# Patient Record
Sex: Female | Born: 1960 | Race: White | Hispanic: No | Smoking: Never smoker
Health system: Southern US, Community
[De-identification: ages and names within clinical notes are randomized; demographics above are authoritative.]

## PROBLEM LIST (undated history)

## (undated) DIAGNOSIS — Z87442 Personal history of urinary calculi: Secondary | ICD-10-CM

## (undated) HISTORY — PX: CHOLECYSTECTOMY: SHX55

---

## 2013-02-17 ENCOUNTER — Other Ambulatory Visit: Payer: Self-pay

## 2013-02-17 DIAGNOSIS — Z1231 Encounter for screening mammogram for malignant neoplasm of breast: Secondary | ICD-10-CM

## 2013-03-12 ENCOUNTER — Ambulatory Visit: Payer: Self-pay

## 2013-04-11 ENCOUNTER — Ambulatory Visit
Admission: RE | Admit: 2013-04-11 | Discharge: 2013-04-11 | Disposition: A | Discharge status: Home / Self Care | Payer: PRIVATE HEALTH INSURANCE | Source: Ambulatory Visit

## 2013-04-11 DIAGNOSIS — Z1231 Encounter for screening mammogram for malignant neoplasm of breast: Secondary | ICD-10-CM

## 2013-04-16 ENCOUNTER — Other Ambulatory Visit: Payer: Self-pay | Admitting: Obstetrics and Gynecology

## 2013-04-16 DIAGNOSIS — R928 Other abnormal and inconclusive findings on diagnostic imaging of breast: Secondary | ICD-10-CM

## 2013-05-06 ENCOUNTER — Ambulatory Visit
Admission: RE | Admit: 2013-05-06 | Discharge: 2013-05-06 | Disposition: A | Discharge status: Home / Self Care | Payer: PRIVATE HEALTH INSURANCE | Source: Ambulatory Visit | Attending: Obstetrics and Gynecology | Admitting: Obstetrics and Gynecology

## 2013-05-06 DIAGNOSIS — R928 Other abnormal and inconclusive findings on diagnostic imaging of breast: Secondary | ICD-10-CM

## 2013-10-28 ENCOUNTER — Other Ambulatory Visit: Payer: Self-pay | Admitting: Obstetrics and Gynecology

## 2013-10-28 DIAGNOSIS — IMO0002 Reserved for concepts with insufficient information to code with codable children: Secondary | ICD-10-CM

## 2013-10-28 DIAGNOSIS — R229 Localized swelling, mass and lump, unspecified: Principal | ICD-10-CM

## 2013-11-05 ENCOUNTER — Ambulatory Visit
Admission: RE | Admit: 2013-11-05 | Discharge: 2013-11-05 | Disposition: A | Discharge status: Home / Self Care | Payer: PRIVATE HEALTH INSURANCE | Source: Ambulatory Visit | Attending: Obstetrics and Gynecology | Admitting: Obstetrics and Gynecology

## 2013-11-05 ENCOUNTER — Encounter (INDEPENDENT_AMBULATORY_CARE_PROVIDER_SITE_OTHER): Payer: Self-pay

## 2013-11-05 DIAGNOSIS — R229 Localized swelling, mass and lump, unspecified: Principal | ICD-10-CM

## 2013-11-05 DIAGNOSIS — IMO0002 Reserved for concepts with insufficient information to code with codable children: Secondary | ICD-10-CM

## 2014-04-27 ENCOUNTER — Other Ambulatory Visit: Payer: Self-pay | Admitting: Obstetrics and Gynecology

## 2014-04-27 DIAGNOSIS — N63 Unspecified lump in unspecified breast: Secondary | ICD-10-CM

## 2014-05-13 ENCOUNTER — Ambulatory Visit
Admission: RE | Admit: 2014-05-13 | Discharge: 2014-05-13 | Disposition: A | Discharge status: Home / Self Care | Payer: PRIVATE HEALTH INSURANCE | Source: Ambulatory Visit | Attending: Obstetrics and Gynecology | Admitting: Obstetrics and Gynecology

## 2014-05-13 DIAGNOSIS — N63 Unspecified lump in unspecified breast: Secondary | ICD-10-CM

## 2015-03-23 ENCOUNTER — Encounter (INDEPENDENT_AMBULATORY_CARE_PROVIDER_SITE_OTHER): Payer: Self-pay | Admitting: *Deleted

## 2015-04-20 ENCOUNTER — Other Ambulatory Visit: Payer: Self-pay

## 2015-04-20 DIAGNOSIS — Z1231 Encounter for screening mammogram for malignant neoplasm of breast: Secondary | ICD-10-CM

## 2015-05-03 ENCOUNTER — Ambulatory Visit (INDEPENDENT_AMBULATORY_CARE_PROVIDER_SITE_OTHER): Payer: PRIVATE HEALTH INSURANCE | Admitting: Internal Medicine

## 2015-05-20 ENCOUNTER — Ambulatory Visit
Admission: RE | Admit: 2015-05-20 | Discharge: 2015-05-20 | Disposition: A | Discharge status: Home / Self Care | Payer: PRIVATE HEALTH INSURANCE | Source: Ambulatory Visit

## 2015-05-20 DIAGNOSIS — Z1231 Encounter for screening mammogram for malignant neoplasm of breast: Secondary | ICD-10-CM

## 2015-08-24 ENCOUNTER — Encounter (INDEPENDENT_AMBULATORY_CARE_PROVIDER_SITE_OTHER): Payer: Self-pay | Admitting: *Deleted

## 2015-09-23 ENCOUNTER — Ambulatory Visit (INDEPENDENT_AMBULATORY_CARE_PROVIDER_SITE_OTHER): Payer: PRIVATE HEALTH INSURANCE | Admitting: Internal Medicine

## 2015-10-13 ENCOUNTER — Ambulatory Visit (INDEPENDENT_AMBULATORY_CARE_PROVIDER_SITE_OTHER): Payer: PRIVATE HEALTH INSURANCE | Admitting: Internal Medicine

## 2016-04-18 ENCOUNTER — Other Ambulatory Visit: Payer: Self-pay | Admitting: Obstetrics and Gynecology

## 2016-04-18 DIAGNOSIS — Z1231 Encounter for screening mammogram for malignant neoplasm of breast: Secondary | ICD-10-CM

## 2016-05-22 ENCOUNTER — Ambulatory Visit: Payer: PRIVATE HEALTH INSURANCE

## 2016-06-06 ENCOUNTER — Ambulatory Visit: Payer: PRIVATE HEALTH INSURANCE

## 2016-06-20 ENCOUNTER — Ambulatory Visit
Admission: RE | Admit: 2016-06-20 | Discharge: 2016-06-20 | Disposition: A | Discharge status: Home / Self Care | Payer: PRIVATE HEALTH INSURANCE | Source: Ambulatory Visit | Attending: Obstetrics and Gynecology | Admitting: Obstetrics and Gynecology

## 2016-06-20 DIAGNOSIS — Z1231 Encounter for screening mammogram for malignant neoplasm of breast: Secondary | ICD-10-CM

## 2017-04-20 ENCOUNTER — Other Ambulatory Visit: Payer: Self-pay | Admitting: Obstetrics and Gynecology

## 2017-04-20 DIAGNOSIS — Z1231 Encounter for screening mammogram for malignant neoplasm of breast: Secondary | ICD-10-CM

## 2017-06-22 ENCOUNTER — Ambulatory Visit
Admission: RE | Admit: 2017-06-22 | Discharge: 2017-06-22 | Disposition: A | Discharge status: Home / Self Care | Payer: PRIVATE HEALTH INSURANCE | Source: Ambulatory Visit | Attending: Obstetrics and Gynecology | Admitting: Obstetrics and Gynecology

## 2017-06-22 DIAGNOSIS — Z1231 Encounter for screening mammogram for malignant neoplasm of breast: Secondary | ICD-10-CM

## 2018-05-20 ENCOUNTER — Other Ambulatory Visit: Payer: Self-pay | Admitting: Obstetrics and Gynecology

## 2018-05-20 DIAGNOSIS — Z1231 Encounter for screening mammogram for malignant neoplasm of breast: Secondary | ICD-10-CM

## 2018-07-02 ENCOUNTER — Ambulatory Visit: Payer: PRIVATE HEALTH INSURANCE

## 2021-01-19 ENCOUNTER — Other Ambulatory Visit: Payer: Self-pay | Admitting: Obstetrics and Gynecology

## 2021-01-19 DIAGNOSIS — R928 Other abnormal and inconclusive findings on diagnostic imaging of breast: Secondary | ICD-10-CM

## 2021-02-11 ENCOUNTER — Ambulatory Visit
Admission: RE | Admit: 2021-02-11 | Discharge: 2021-02-11 | Disposition: A | Discharge status: Home / Self Care | Payer: No Typology Code available for payment source | Source: Ambulatory Visit | Attending: Obstetrics and Gynecology | Admitting: Obstetrics and Gynecology

## 2021-02-11 ENCOUNTER — Other Ambulatory Visit: Payer: Self-pay

## 2021-02-11 DIAGNOSIS — R928 Other abnormal and inconclusive findings on diagnostic imaging of breast: Secondary | ICD-10-CM

## 2021-02-11 HISTORY — PX: BREAST BIOPSY: SHX20

## 2021-09-07 ENCOUNTER — Other Ambulatory Visit: Payer: Self-pay | Admitting: Obstetrics and Gynecology

## 2021-09-07 DIAGNOSIS — Z1231 Encounter for screening mammogram for malignant neoplasm of breast: Secondary | ICD-10-CM

## 2022-01-06 ENCOUNTER — Ambulatory Visit
Admission: RE | Admit: 2022-01-06 | Discharge: 2022-01-06 | Disposition: A | Discharge status: Home / Self Care | Payer: No Typology Code available for payment source | Source: Ambulatory Visit | Attending: Obstetrics and Gynecology | Admitting: Obstetrics and Gynecology

## 2022-01-06 DIAGNOSIS — Z1231 Encounter for screening mammogram for malignant neoplasm of breast: Secondary | ICD-10-CM

## 2022-09-20 ENCOUNTER — Other Ambulatory Visit: Payer: Self-pay | Admitting: Family Medicine

## 2022-09-20 DIAGNOSIS — Z1231 Encounter for screening mammogram for malignant neoplasm of breast: Secondary | ICD-10-CM

## 2022-12-14 ENCOUNTER — Encounter (INDEPENDENT_AMBULATORY_CARE_PROVIDER_SITE_OTHER): Payer: Self-pay | Admitting: *Deleted

## 2023-01-03 ENCOUNTER — Telehealth (INDEPENDENT_AMBULATORY_CARE_PROVIDER_SITE_OTHER): Payer: Self-pay | Admitting: Gastroenterology

## 2023-01-03 MED ORDER — PEG 3350-KCL-NA BICARB-NACL 420 G PO SOLR: 4000.0000 mL | Freq: Once | ORAL | 0 refills | Status: AC

## 2023-01-03 NOTE — Addendum Note (Signed)
Addended by: Marlowe Shores on: 01/03/2023 03:52 PM   Modules accepted: Orders

## 2023-01-03 NOTE — Telephone Encounter (Signed)
Pt contacted and colonoscopy scheduled for 01/19/23 at 8:45 am with Dr.Ahmed. prep sent to pharmacy and instructions mailed to patient.

## 2023-01-03 NOTE — Telephone Encounter (Signed)
Who is your primary care physician: [facility]  Reasons for the colonoscopy: Screening  Have you had a colonoscopy before?  Yes oct 2014  Do you have family history of colon cancer? no  Previous colonoscopy with polyps removed? no  Do you have a history colorectal cancer?   no  Are you diabetic? If yes, Type 1 or Type 2?    no  Do you have a prosthetic or mechanical heart valve? no  Do you have a pacemaker/defibrillator?   no  Have you had endocarditis/atrial fibrillation? no  Have you had joint replacement within the last 12 months?  no  Do you tend to be constipated or have to use laxatives? yes  Do you have any history of drugs or alchohol?  no  Do you use supplemental oxygen?  no  Have you had a stroke or heart attack within the last 6 months? no  Do you take weight loss medication?  no  For female patients: have you had a hysterectomy?  no                                     are you post menopausal?       yes                                            do you still have your menstrual cycle? no      Do you take any blood-thinning medications such as: (aspirin, warfarin, Plavix, Aggrenox)  no  If yes we need the name, milligram, dosage and who is prescribing doctor  Current Outpatient Medications on File Prior to Visit  Medication Sig Dispense Refill   omeprazole (PRILOSEC) 20 MG capsule Take 20 mg by mouth 2 (two) times daily.     topiramate (TOPAMAX) 50 MG tablet SMARTSIG:1 Tablet(s) By Mouth Every Evening     No current facility-administered medications on file prior to visit.    No Known Allergies   Pharmacy: [facility]  Primary Insurance Name: [employer] number where you can be reached: [phone]

## 2023-01-08 ENCOUNTER — Encounter (INDEPENDENT_AMBULATORY_CARE_PROVIDER_SITE_OTHER): Payer: Self-pay | Admitting: *Deleted

## 2023-01-08 NOTE — Telephone Encounter (Signed)
Referral completed

## 2023-01-09 ENCOUNTER — Ambulatory Visit
Admission: RE | Admit: 2023-01-09 | Discharge: 2023-01-09 | Disposition: A | Discharge status: Home / Self Care | Payer: 59 | Source: Ambulatory Visit | Attending: Family Medicine | Admitting: Family Medicine

## 2023-01-09 DIAGNOSIS — Z1231 Encounter for screening mammogram for malignant neoplasm of breast: Secondary | ICD-10-CM

## 2023-01-18 ENCOUNTER — Encounter (HOSPITAL_COMMUNITY): Payer: Self-pay | Admitting: Certified Registered Nurse Anesthetist

## 2023-01-19 ENCOUNTER — Ambulatory Visit (HOSPITAL_COMMUNITY): Admission: RE | Admit: 2023-01-19 | Payer: 59 | Source: Home / Self Care

## 2023-01-19 ENCOUNTER — Encounter (HOSPITAL_COMMUNITY): Admission: RE | Payer: Self-pay | Source: Home / Self Care

## 2023-01-19 DIAGNOSIS — Z1211 Encounter for screening for malignant neoplasm of colon: Secondary | ICD-10-CM

## 2023-01-19 SURGERY — COLONOSCOPY WITH PROPOFOL
Anesthesia: Monitor Anesthesia Care

## 2023-01-22 ENCOUNTER — Telehealth: Payer: Self-pay | Admitting: *Deleted

## 2023-01-22 NOTE — Telephone Encounter (Signed)
Called pt to reschedule procedure from 7/19 with Dr. Tasia Catchings. She is going to have to call back once she looks at her schedule and see when she is off to do prep.

## 2023-01-23 NOTE — Telephone Encounter (Signed)
Pt left message returning call Pt would like to hold off until September due to her and her husband being off same days in September. Pt was looking at dates 03/06/23 and 03/20/23. Will call pt once September schedule out.  TCS ASA 1

## 2023-02-14 MED ORDER — PEG 3350-KCL-NA BICARB-NACL 420 G PO SOLR: 4000.0000 mL | Freq: Once | ORAL | 0 refills | Status: AC

## 2023-02-14 NOTE — Addendum Note (Signed)
Addended by: Marlowe Shores on: 02/14/2023 03:12 PM   Modules accepted: Orders

## 2023-02-14 NOTE — Telephone Encounter (Signed)
Pt contacted and rescheduled for 03/12/23 at 11:00am. Prep sent to pharmacy. Instructions mailed to patient.

## 2023-03-08 ENCOUNTER — Telehealth: Payer: Self-pay | Admitting: *Deleted

## 2023-03-08 NOTE — Telephone Encounter (Signed)
Pt called and stated that she has a kidney stone and UTI. She is currently on medication for this. Her procedure is scheduled for 03/12/23. She wants to make sure everything is ok for her to do the procedure. Please advise. Thank you

## 2023-03-08 NOTE — Telephone Encounter (Signed)
I do not see any contraindication to screening colonoscopy as long as she is not having fever , chills or any severe abdominal pain

## 2023-03-08 NOTE — Telephone Encounter (Signed)
Pt informed of providers message. States she is not having any of the symptoms and will see him on Monday.

## 2023-03-12 ENCOUNTER — Encounter (INDEPENDENT_AMBULATORY_CARE_PROVIDER_SITE_OTHER): Payer: Self-pay | Admitting: *Deleted

## 2023-03-12 ENCOUNTER — Ambulatory Visit (HOSPITAL_COMMUNITY): Payer: No Typology Code available for payment source | Admitting: Certified Registered"

## 2023-03-12 ENCOUNTER — Encounter (HOSPITAL_COMMUNITY)
Admission: RE | Disposition: A | Discharge status: Home / Self Care | Payer: Self-pay | Source: Home / Self Care | Attending: Gastroenterology

## 2023-03-12 ENCOUNTER — Encounter (HOSPITAL_COMMUNITY): Payer: Self-pay

## 2023-03-12 ENCOUNTER — Ambulatory Visit (HOSPITAL_COMMUNITY)
Admission: RE | Admit: 2023-03-12 | Discharge: 2023-03-12 | Disposition: A | Discharge status: Home / Self Care | Payer: No Typology Code available for payment source | Attending: Gastroenterology | Admitting: Gastroenterology

## 2023-03-12 ENCOUNTER — Other Ambulatory Visit: Payer: Self-pay

## 2023-03-12 DIAGNOSIS — K648 Other hemorrhoids: Secondary | ICD-10-CM | POA: Diagnosis not present

## 2023-03-12 DIAGNOSIS — Z1211 Encounter for screening for malignant neoplasm of colon: Secondary | ICD-10-CM | POA: Diagnosis present

## 2023-03-12 DIAGNOSIS — D122 Benign neoplasm of ascending colon: Secondary | ICD-10-CM | POA: Diagnosis not present

## 2023-03-12 DIAGNOSIS — D125 Benign neoplasm of sigmoid colon: Secondary | ICD-10-CM

## 2023-03-12 DIAGNOSIS — K644 Residual hemorrhoidal skin tags: Secondary | ICD-10-CM | POA: Diagnosis not present

## 2023-03-12 DIAGNOSIS — D126 Benign neoplasm of colon, unspecified: Secondary | ICD-10-CM

## 2023-03-12 HISTORY — PX: COLONOSCOPY WITH PROPOFOL: SHX5780

## 2023-03-12 HISTORY — DX: Personal history of urinary calculi: Z87.442

## 2023-03-12 HISTORY — PX: POLYPECTOMY: SHX5525

## 2023-03-12 LAB — HM COLONOSCOPY

## 2023-03-12 SURGERY — COLONOSCOPY WITH PROPOFOL
Anesthesia: General

## 2023-03-12 MED ORDER — PROPOFOL 1000 MG/100ML IV EMUL
INTRAVENOUS | Status: AC
  Filled 2023-03-12: qty 100

## 2023-03-12 MED ORDER — LACTATED RINGERS IV SOLN
INTRAVENOUS | Status: DC | PRN
Start: 2023-03-12 — End: 2023-03-12

## 2023-03-12 MED ORDER — PROPOFOL 10 MG/ML IV BOLUS
INTRAVENOUS | Status: DC | PRN
Start: 2023-03-12 — End: 2023-03-12
  Administered 2023-03-12: 100 mg via INTRAVENOUS

## 2023-03-12 MED ORDER — LACTATED RINGERS IV SOLN: INTRAVENOUS | Status: DC

## 2023-03-12 MED ORDER — LIDOCAINE HCL (CARDIAC) PF 100 MG/5ML IV SOSY
PREFILLED_SYRINGE | INTRAVENOUS | Status: DC | PRN
  Administered 2023-03-12: 100 mg via INTRAVENOUS

## 2023-03-12 MED ORDER — PROPOFOL 500 MG/50ML IV EMUL
INTRAVENOUS | Status: DC | PRN
  Administered 2023-03-12: 150 ug/kg/min via INTRAVENOUS

## 2023-03-12 MED ORDER — STERILE WATER FOR IRRIGATION IR SOLN
Status: DC | PRN
  Administered 2023-03-12: 50 mL

## 2023-03-12 NOTE — H&P (Signed)
Primary Care Physician:  Lawerance Sabal, [place] Primary Gastroenterologist:  Dr. Tasia Catchings  Pre-Procedure History & Physical: HPI:  Belinda Cortez is a 62 y.o. female is here for a colonoscopy for colon cancer screening purposes.  Patient denies any family history of colorectal cancer.  No melena or hematochezia.  No abdominal pain or unintentional weight loss.  No change in bowel habits.  Overall feels well from a GI standpoint.  Last colonoscopy 2014   No past medical history on file.  Past Surgical History:  Procedure Laterality Date   BREAST BIOPSY Left 02/11/2021   FLORID USUAL DUCTAL HYPERPLASIA, COLUMNAR CELL    Prior to Admission medications   Medication Sig Start Date End Date Taking? Authorizing Provider  amoxicillin (AMOXIL) 500 MG capsule Take 500 mg by mouth 2 (two) times daily.   Yes [provider]  omeprazole (PRILOSEC) 20 MG capsule Take 20 mg by mouth daily as needed (Heartburn). 09/20/22  Yes [provider]  tamsulosin (FLOMAX) 0.4 MG CAPS capsule Take 0.4 mg by mouth at bedtime.   Yes [provider]  Glycerin-Hypromellose-PEG 400 (DRY EYE RELIEF DROPS OP) Place 1 drop into both eyes daily as needed (dry/ itching eyes).    [provider]  topiramate (TOPAMAX) 50 MG tablet Take 50 mg by mouth at bedtime as needed (Sleep). 12/13/22   [provider]    Allergies as of 02/14/2023   (No Known Allergies)    Family History  Problem Relation Age of Onset   Breast cancer Neg Hx     Social History   Socioeconomic History   Marital status: Unknown    Spouse name: Not on file   Number of children: Not on file   Years of education: Not on file   Highest education level: Not on file  Occupational History   Not on file  Tobacco Use   Smoking status: Not on file   Smokeless tobacco: Not on file  Substance and Sexual Activity   Alcohol use: Not on file   Drug use: Not on file   Sexual activity: Not on file  Other Topics  Concern   Not on file  Social History Narrative   Not on file   Social Determinants of Health   Financial Resource Strain: Not on file  Food Insecurity: No Food Insecurity (08/28/2019)   Received from [facility], [facility]   Hunger Vital Sign    Worried About Running Out of Food in the Last Year: Never true    Ran Out of Food in the Last Year: Never true  Transportation Needs: No Transportation Needs (08/28/2019)   Received from [facility], [facility]   [facility] - Transportation    Lack of Transportation (Medical): No    Lack of Transportation (Non-Medical): No  Physical Activity: Inactive (01/10/2023)   Received from [facility]   Exercise Vital Sign    Days of Exercise per Week: 0 days    Minutes of Exercise per Session: 0 min  Stress: No Stress Concern Present (01/10/2023)   Received from [facility] - Occupational Stress Questionnaire    Feeling of Stress : Only a little  Social Connections: Not on file  Intimate Partner Violence: Not At Risk (01/10/2023)   Received from [facility]   Humiliation, Afraid, Rape, and Kick questionnaire    Fear of Current or Ex-Partner: No    Emotionally Abused: No  Physically Abused: No    Sexually Abused: No    Review of Systems: See HPI, otherwise negative ROS  Physical Exam: Vital signs in last 24 hours:     General:   Alert,  Well-developed, well-nourished, pleasant and cooperative in NAD Head:  Normocephalic and atraumatic. Eyes:  Sclera clear, no icterus.   Conjunctiva pink. Ears:  Normal auditory acuity. Nose:  No deformity, discharge,  or lesions. Msk:  Symmetrical without gross deformities. Normal posture. Extremities:  Without clubbing or edema. Neurologic:  Alert and  oriented x4;  grossly normal neurologically. Skin:  Intact without significant lesions or rashes. Psych:  Alert and cooperative. Normal mood and affect.  Impression/Plan: Belinda Cortez is here for a colonoscopy to be performed for colon cancer screening purposes.  The risks of the procedure including infection, bleed, or perforation as well as benefits, limitations, alternatives and imponderables have been reviewed with the patient. Questions have been answered. All parties agreeable.

## 2023-03-12 NOTE — Anesthesia Postprocedure Evaluation (Signed)
Anesthesia Post Note  Patient: Belinda Cortez  Procedure(s) Performed: COLONOSCOPY WITH PROPOFOL POLYPECTOMY  Patient location during evaluation: Phase II Anesthesia Type: General Level of consciousness: awake Pain management: pain level controlled Vital Signs Assessment: post-procedure vital signs reviewed and stable Respiratory status: spontaneous breathing and respiratory function stable Cardiovascular status: blood pressure returned to baseline and stable Postop Assessment: no headache and no apparent nausea or vomiting Anesthetic complications: no Comments: Late entry   No notable events documented.   Last Vitals:  Vitals:   03/12/23 0929 03/12/23 1113  BP: (!) 144/64 (!) 115/49  Pulse: 65 71  Resp: 18 20  Temp: 36.9 C 36.6 C  SpO2: 97% 97%    Last Pain:  Vitals:   03/12/23 1113  TempSrc: Oral  PainSc: 0-No pain                 Windell Norfolk

## 2023-03-12 NOTE — Op Note (Signed)
[facility] Patient Name: Belinda Cortez Procedure Date: 03/12/2023 10:31 AM MRN: [id number] Date of Birth: [birth year] Attending MD: Sanjuan Dame , MD, [id number] CSN: [id number] Age: 62 Admit Type: Outpatient Procedure:                Colonoscopy Indications:              Screening for colorectal malignant neoplasm Providers:                Sanjuan Dame, MD, Buel Ream. Thomasena Edis RN, RN,                            Lennice Sites Technician, Pensions consultant Referring MD:              Medicines:                Monitored Anesthesia Care Complications:            No immediate complications. Estimated Blood Loss:     Estimated blood loss: none. Procedure:                Pre-Anesthesia Assessment:                           - Prior to the procedure, a History and Physical                            was performed, and patient medications and                            allergies were reviewed. The patient's tolerance of                            previous anesthesia was also reviewed. The risks                            and benefits of the procedure and the sedation                            options and risks were discussed with the patient.                            All questions were answered, and informed consent                            was obtained. Prior Anticoagulants: The patient has                            taken no anticoagulant or antiplatelet agents. ASA                            Grade Assessment: II - A patient with mild systemic                            disease. After reviewing the risks and benefits,  the patient was deemed in satisfactory condition to                            undergo the procedure.                           After obtaining informed consent, the colonoscope                            was passed under direct vision. Throughout the                            procedure, the patient's blood pressure, pulse, and                             oxygen saturations were monitored continuously. The                            [id number]) scope was introduced through                            the anus and advanced to the the terminal ileum.                            The colonoscopy was performed without difficulty.                            The patient tolerated the procedure well. The                            quality of the bowel preparation was evaluated                            using the BBPS [facility] Bowel Preparation Scale)                            with scores of: Right Colon = 2 (minor amount of                            residual staining, small fragments of stool and/or                            opaque liquid, but mucosa seen well), Transverse                            Colon = 2 (minor amount of residual staining, small                            fragments of stool and/or opaque liquid, but mucosa                            seen well) and Left Colon = 2 (minor amount of  residual staining, small fragments of stool and/or                            opaque liquid, but mucosa seen well). The total                            BBPS score equals 6. The terminal ileum, ileocecal                            valve, appendiceal orifice, and rectum were                            photographed. Scope In: 10:47:02 AM Scope Out: 11:09:23 AM Scope Withdrawal Time: 0 hours 18 minutes 38 seconds  Total Procedure Duration: 0 hours 22 minutes 21 seconds  Findings:      The perianal and digital rectal examinations were normal.      A 4 to 8 mm polyp was found in the ascending colon. The polyp was       sessile. The polyp was removed with a cold snare. Resection and       retrieval were complete.      Non-bleeding external and internal hemorrhoids were found during       retroflexion.      The terminal ileum appeared normal. Impression:               - One 4 to 8 mm polyp in the ascending colon,                             removed with a cold snare. Resected and retrieved.                           - Non-bleeding external and internal hemorrhoids.                           - The examined portion of the ileum was normal. Moderate Sedation:      Per Anesthesia Care Recommendation:           - Patient has a contact number available for                            emergencies. The signs and symptoms of potential                            delayed complications were discussed with the                            patient. Return to normal activities tomorrow.                            Written discharge instructions were provided to the                            patient.                           -  Resume previous diet.                           - Continue present medications.                           - Await pathology results.                           - Repeat colonoscopy for surveillance based on                            pathology results.                           - Return to primary care physician as previously                            scheduled. Procedure Code(s):        --- Professional ---                           [phone], Colonoscopy, flexible; with removal of                            tumor(s), polyp(s), or other lesion(s) by snare                            technique Diagnosis Code(s):        --- Professional ---                           Z12.11, Encounter for screening for malignant                            neoplasm of colon                           D12.2, Benign neoplasm of ascending colon                           K64.8, Other hemorrhoids CPT copyright 2022 American Medical Association. All rights reserved. The codes documented in this report are preliminary and upon coder review may  be revised to meet current compliance requirements. Sanjuan Dame, MD Sanjuan Dame, MD 03/12/2023 11:19:07 AM This report has been signed electronically. Number of Addenda: 0

## 2023-03-12 NOTE — Transfer of Care (Addendum)
Immediate Anesthesia Transfer of Care Note  Patient: Belinda Cortez  Procedure(s) Performed: COLONOSCOPY WITH PROPOFOL POLYPECTOMY  Patient Location: Short Stay  Anesthesia Type:General  Level of Consciousness: drowsy and patient cooperative  Airway & Oxygen Therapy: Patient Spontanous Breathing and Patient connected to nasal cannula oxygen  Post-op Assessment: Report given to RN and Post -op Vital signs reviewed and stable  Post vital signs: Reviewed and stable  Last Vitals:  Vitals Value Taken Time  BP 115/49 03/12/23   1113  Temp 36.6 03/12/23   1113  Pulse 71 03/12/23   1113  Resp 20 03/12/23   1113  SpO2 97% 03/12/23   1113    Last Pain:  Vitals:   03/12/23 1043  TempSrc:   PainSc: 0-No pain      Patients Stated Pain Goal: 8 (03/12/23 0929)  Complications: No notable events documented.

## 2023-03-12 NOTE — OR Nursing (Signed)
Belinda Cortez had a procedure at [facility] on 03/12/23 and cannot return to work until noon on 03/13/23.

## 2023-03-12 NOTE — Anesthesia Preprocedure Evaluation (Signed)
Anesthesia Evaluation  Patient identified by MRN, date of birth, ID band Patient awake    Reviewed: Allergy & Precautions, H&P , NPO status , Patient's Chart, lab work & pertinent test results, reviewed documented beta blocker date and time   Airway Mallampati: II  TM Distance: >3 FB Neck ROM: full    Dental no notable dental hx.    Pulmonary neg pulmonary ROS   Pulmonary exam normal breath sounds clear to auscultation       Cardiovascular Exercise Tolerance: Good negative cardio ROS  Rhythm:regular Rate:Normal     Neuro/Psych negative neurological ROS  negative psych ROS   GI/Hepatic negative GI ROS, Neg liver ROS,,,  Endo/Other  negative endocrine ROS    Renal/GU negative Renal ROS  negative genitourinary   Musculoskeletal   Abdominal   Peds  Hematology negative hematology ROS (+)   Anesthesia Other Findings   Reproductive/Obstetrics negative OB ROS                             Anesthesia Physical Anesthesia Plan  ASA: 2  Anesthesia Plan: General   Post-op Pain Management:    Induction:   PONV Risk Score and Plan: Propofol infusion  Airway Management Planned:   Additional Equipment:   Intra-op Plan:   Post-operative Plan:   Informed Consent: I have reviewed the patients History and Physical, chart, labs and discussed the procedure including the risks, benefits and alternatives for the proposed anesthesia with the patient or authorized representative who has indicated his/her understanding and acceptance.     Dental Advisory Given  Plan Discussed with: CRNA  Anesthesia Plan Comments:        Anesthesia Quick Evaluation  

## 2023-03-13 LAB — SURGICAL PATHOLOGY

## 2023-03-14 NOTE — Progress Notes (Signed)
I reviewed the pathology results. Ann, can you send her a letter with the findings as described below please? Repeat colonoscopy in 7 years  Thanks,  Vista Lawman, MD Gastroenterology and Hepatology [facility]  ---------------------------------------------------------------------------------------------  [facility] [address], [place], [place] [address] Phone:  [phone]   03/14/23 [place], [place]   Dear Charlotte Sanes,  I am writing to inform you that the biopsies taken during your recent endoscopic examination showed:  Tubular Adenoma   I am writing to let you know the results of your recent colonoscopy.  You had a total of 1 polyp removed. The pathology came back as "tubular adenoma." These findings are NOT cancer, but had the polyp remained in your colon, they could have turned into cancer.  Given these findings, it is recommended that your next colonoscopy be performed in 7 years.  Please call us at [phone] if you have persistent problems or have questions about your condition that have not been fully answered at this time.  Sincerely,  Vista Lawman, MD Gastroenterology and Hepatology

## 2023-03-15 ENCOUNTER — Encounter (INDEPENDENT_AMBULATORY_CARE_PROVIDER_SITE_OTHER): Payer: Self-pay | Admitting: *Deleted

## 2023-03-20 ENCOUNTER — Encounter (HOSPITAL_COMMUNITY): Payer: Self-pay | Admitting: Gastroenterology

## 2023-08-10 IMAGING — MG MM BREAST LOCALIZATION CLIP
4 series · 4 of 12 positions shown · non-contrast
Comparison: Previous exam(s).

CLINICAL DATA: Evaluate biopsy marker

EXAM:
3D DIAGNOSTIC LEFT MAMMOGRAM POST ULTRASOUND BIOPSY

[L ML synth-2D]
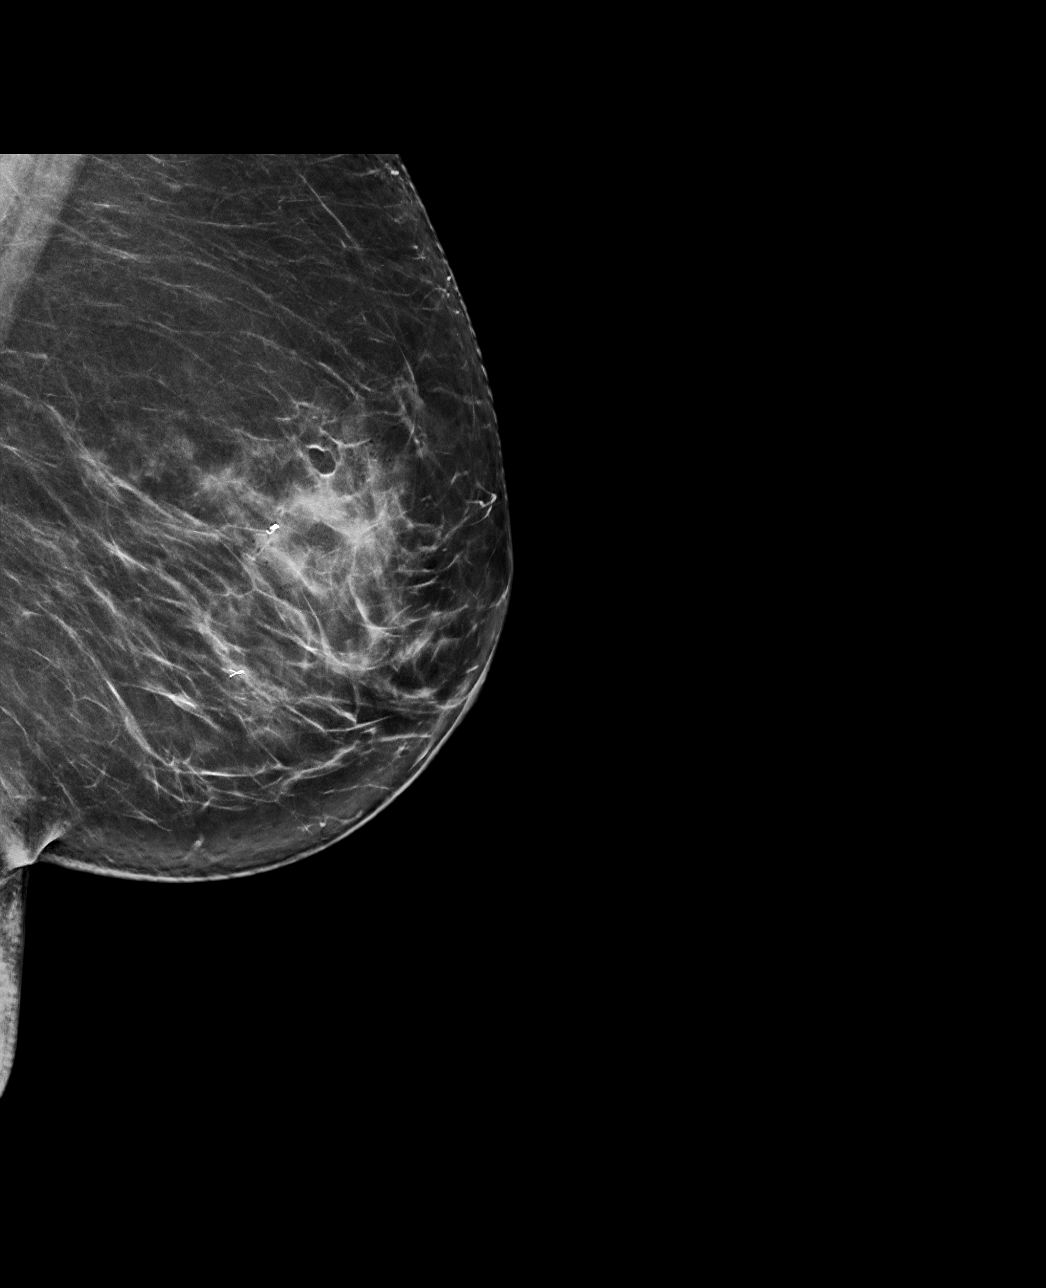

[L CC synth-2D]
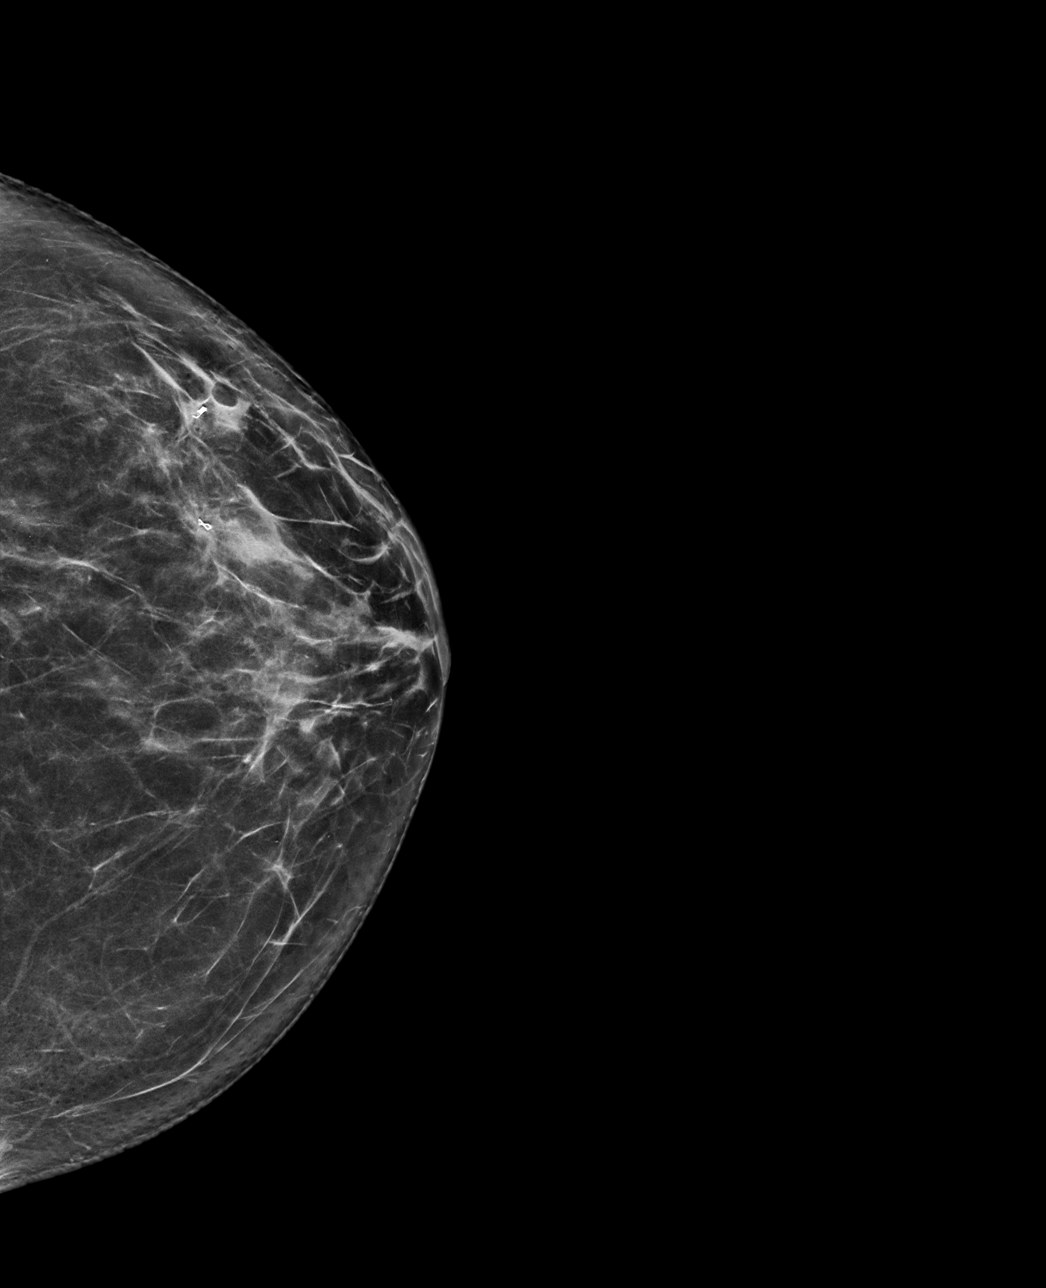

[L CC tomo · tomo slice 37/74.0]
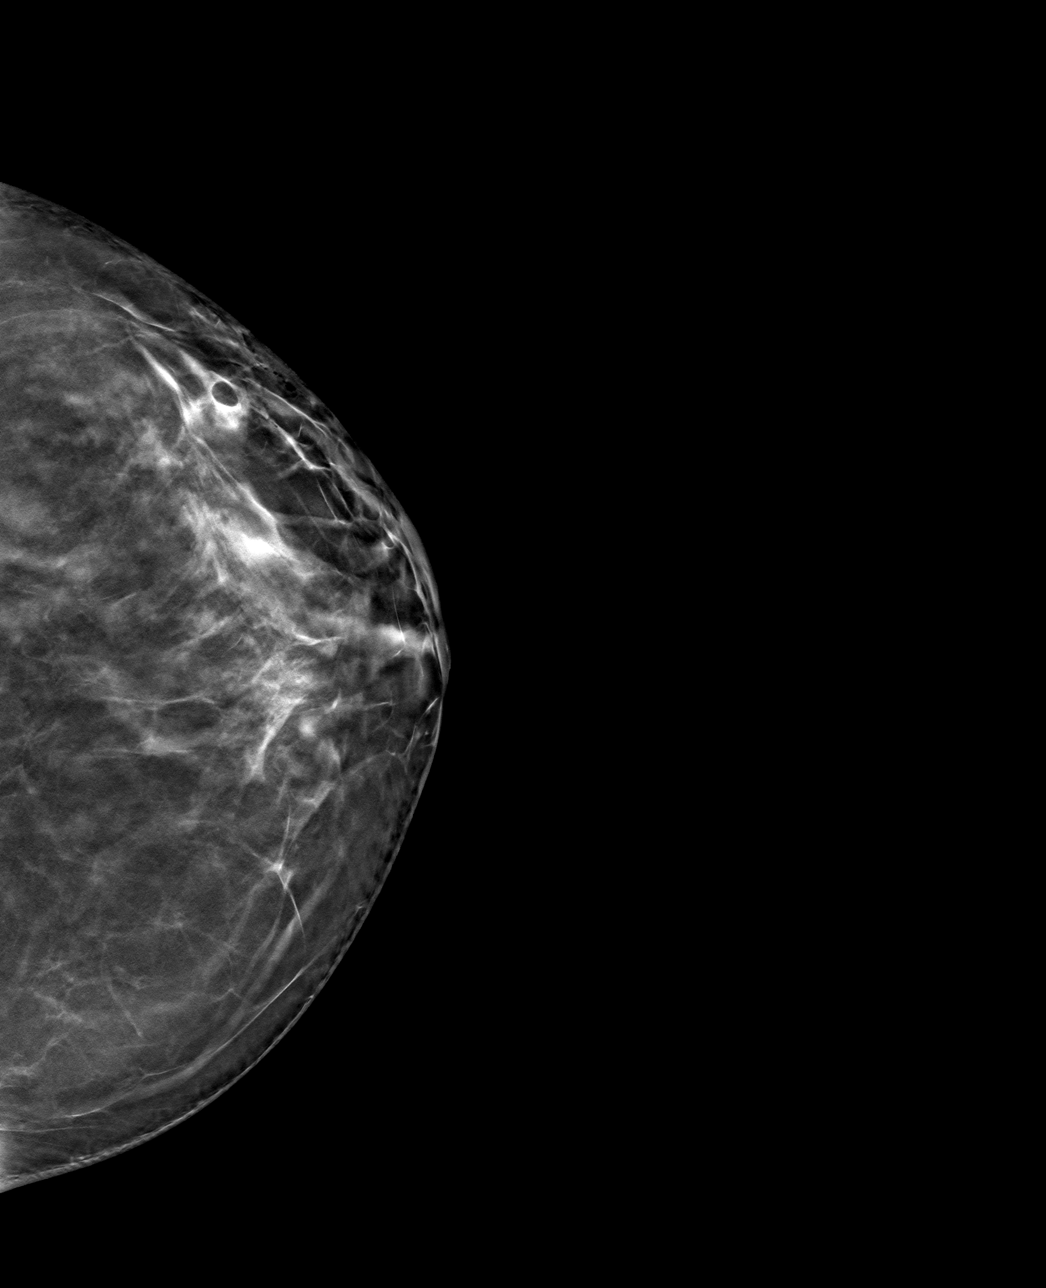

[L ML tomo · tomo slice 45/88.0]
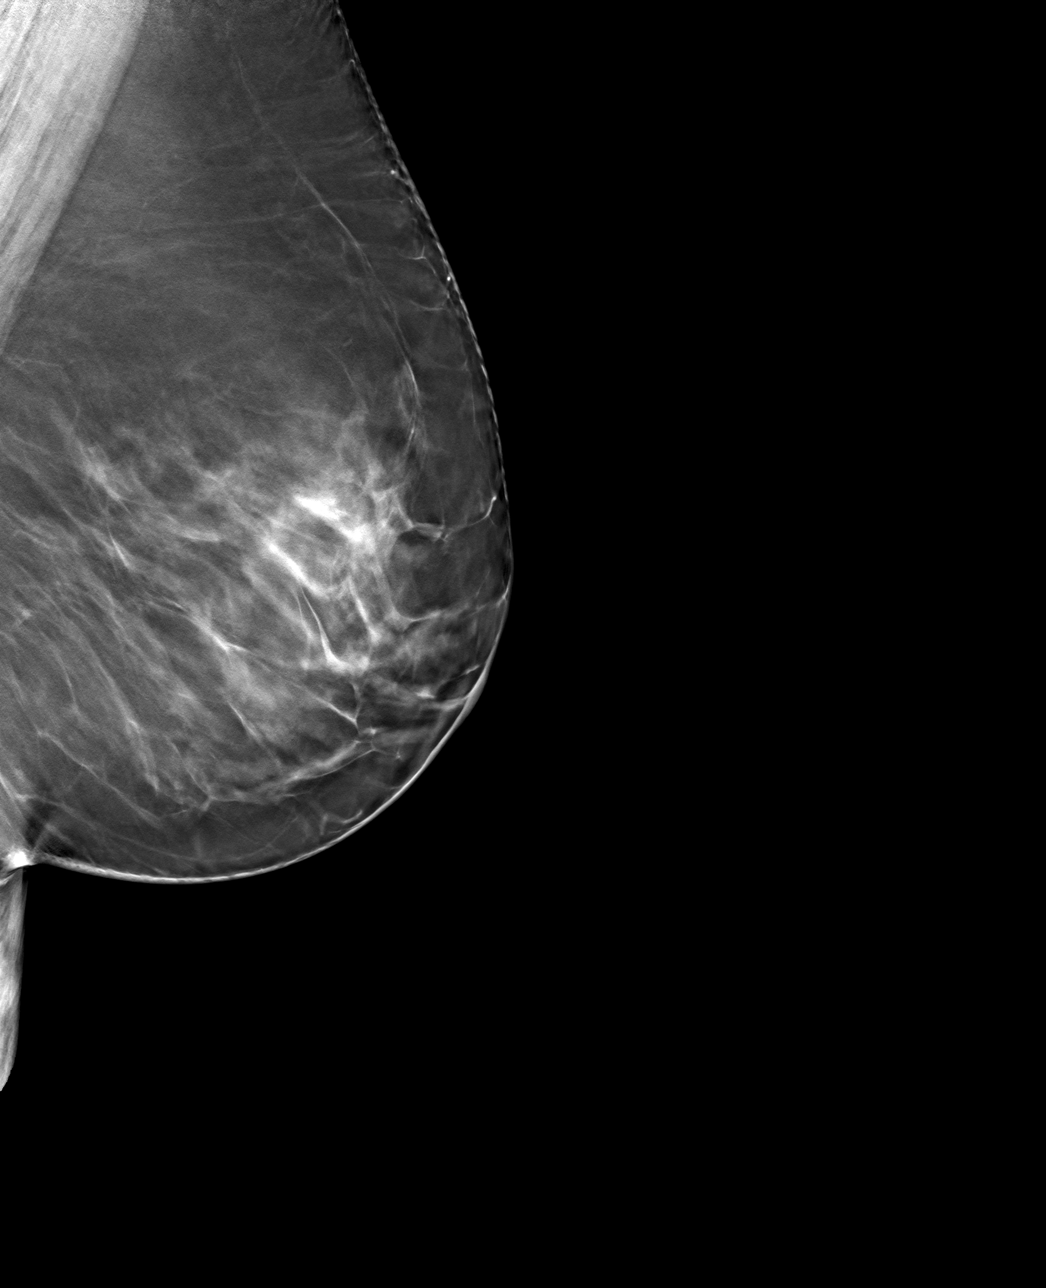

[4 of 12 positions shown; findings below may reference images not displayed]

FINDINGS: 3D Mammographic images were obtained following stereotactic guided
biopsy of a mass in the upper outer left breast. The coil shaped
biopsy marker migrated 8.5 mm inferior to the biopsied mass.
IMPRESSION: The coil shaped biopsy marker migrated 8.5 mm inferior to the
biopsied mass.

Final Assessment: Post Procedure Mammograms for Marker Placement
